# Patient Record
Sex: Female | Born: 1960 | Race: White | Hispanic: No | Smoking: Current every day smoker
Health system: Southern US, Community
[De-identification: ages and names within clinical notes are randomized; demographics above are authoritative.]

---

## 1998-05-18 ENCOUNTER — Ambulatory Visit (HOSPITAL_COMMUNITY): Admission: RE | Admit: 1998-05-18 | Discharge: 1998-05-18 | Payer: Self-pay | Admitting: Obstetrics and Gynecology

## 1998-07-24 ENCOUNTER — Ambulatory Visit (HOSPITAL_COMMUNITY): Admission: RE | Admit: 1998-07-24 | Discharge: 1998-07-24 | Payer: Self-pay | Admitting: Obstetrics and Gynecology

## 1999-01-09 ENCOUNTER — Other Ambulatory Visit: Admission: RE | Admit: 1999-01-09 | Discharge: 1999-01-09 | Payer: Self-pay | Admitting: Obstetrics and Gynecology

## 2000-06-29 ENCOUNTER — Ambulatory Visit (HOSPITAL_COMMUNITY): Admission: RE | Admit: 2000-06-29 | Discharge: 2000-06-29 | Payer: Self-pay | Admitting: Endocrinology

## 2000-06-29 ENCOUNTER — Encounter: Payer: Self-pay | Admitting: Endocrinology

## 2001-06-23 ENCOUNTER — Inpatient Hospital Stay (HOSPITAL_COMMUNITY): Admission: RE | Admit: 2001-06-23 | Discharge: 2001-06-25 | Payer: Self-pay | Admitting: Neurosurgery

## 2008-05-24 ENCOUNTER — Ambulatory Visit (HOSPITAL_COMMUNITY): Admission: RE | Admit: 2008-05-24 | Discharge: 2008-05-24 | Payer: Self-pay | Admitting: *Deleted

## 2011-04-22 NOTE — Op Note (Signed)
[name], [name]            ACCOUNT NO.:  [id number]   MEDICAL RECORD NO.:  [id number]          PATIENT TYPE:  AMB   LOCATION:  SDC                           FACILITY:  [facility]   PHYSICIAN:  Stout B. Earlene Plater, M.D.  DATE OF BIRTH:  [birth year]   DATE OF PROCEDURE:  DATE OF DISCHARGE:                               OPERATIVE REPORT   PREOPERATIVE DIAGNOSIS:  Labial hypertrophy.   POSTOPERATIVE DIAGNOSIS:  Labial hypertrophy.   PROCEDURE:  Labioplasty.   SURGEON:  Chester Holstein. Earlene Plater, M.D.   ASSISTANT:  None.   ANESTHESIA:  MAC and 10 mL of 1% Nesacaine local.   FINDINGS:  Bilateral labial hypertrophy.   SPECIMENS:  None.   BLOOD LOSS:  Minimal.   COMPLICATIONS:  None.   INDICATIONS:  The patient with symptomatic labial hypertrophy requesting  reduction in labial tissue to relieve dyspareunia, as well as discomfort  with tight fitting clothes.  The patient is made aware preoperatively in  the office this is not a cosmetic procedure, but rather simply debulking  of the redundant labial tissue to relieve her discomfort.  The patient  advised the risks of surgery including infection, bleeding, the damage  to surrounding organs, and potential increased risk for dyspareunia  postoperatively.   PROCEDURE:  The patient was taken to the operating room, MAC anesthesia  obtained.  She was prepped and draped in standard fashion.  Bladder  drained with an in-and-out cath.  Exam under anesthesia shows a  redundant labial tissue approximately 3 cm in anterior posterior  dimension.  The redundant labial tissue was marked with surgical marking  pen and infiltrated with local.  It was then excised with #15 blade and  skin reapproximated with a subcuticular stitch of 4-0 Vicryl.  Repeated  on the opposite side in the same manner.  Each site was hemostatic.   The patient tolerated the procedure well with no complications.  She was  taken to the recovery room awake, alert, and in good  condition.  All  counts were correct per the operating staff.      Gerri Spore B. Earlene Plater, M.D.  Electronically Signed     WBD/MEDQ  D:  05/24/2008  T:  05/25/2008  Job:  [id number]

## 2011-04-25 NOTE — H&P (Signed)
[name]  Patient:    Sandra [name], Sandra [name]                   MRN: [id number] Adm. Date:  [id number] Attending:  Tressie Stalker D                         History and Physical  CHIEF COMPLAINT:  Bilateral back pain, right greater than left leg pain.  HISTORY OF PRESENT ILLNESS:  The patient is a 50 year old white female who began having back pain a few months ago.  It became severe three weeks a go.  She failed medical management, worked up with a lumbar MRI that demonstrated a huge herniated disk at L3-4.  I recommended surgery, and the patient weighed the risks, benefits, and alternatives of surgery and decided to proceed with the operation.  PAST MEDICAL HISTORY:  Positive for ocular strabismus.  PAST SURGICAL HISTORY:  Eye surgery as a child.  MEDICATIONS:  Flexeril 10 mg p.o. t.i.d., prednisone taper, hydrocodone p.r.n.  ALLERGIES:  No known drug allergies.  FAMILY HISTORY:  The patients mother is age 92, in good health.  The patients father died at age 40 of myocardial infarction.  SOCIAL HISTORY:  The patient is married and has three children.  She lives in [place].  She smokes one pack per day of cigarettes x approximately 20 years.  I advised her to quit.  She drinks alcohol rarely.  Denies drug use.  REVIEW OF SYSTEMS:  Negative except as above.  PHYSICAL EXAMINATION:  VITAL SIGNS:  Height 5 feet 3 inches, weight 110 pounds.  GENERAL:  A pleasant, thin 50 year old white female, who walks with an antalgic gait, complaining of back and leg pain.  HEENT:  Normal.  NECK:  Normal.  CHEST:  Thorax symmetric.  Lungs clear to auscultation.  CARDIAC:  Regular rate and rhythm.  ABDOMEN:  Soft, nontender.  BACK:  No point tenderness, deformity.  Straight leg raise testing is positive bilaterally.  Fabers testing negative bilaterally.  NEUROLOGIC:  The patient is alert and oriented x 3.  Cranial nerves II-XII grossly intact  bilaterally.  Vision and hearing grossly normal.  Bilateral motor strength is 5/5 in bilateral deltoid, biceps, triceps, hand grip, and wrist extensor, interosseous, psoas, quadriceps, gastrocnemius, extensor hallucis longus, and left quadriceps.  Right quadriceps strength is diminished at 4+/5.  Cerebellar exam is intact in her rapid alternating movements of the upper extremities bilaterally.  Sensory exam is normal to light touch and pinprick sensation in all tested dermatomes bilaterally.  Deep tendon reflexes are 2/4 in bilateral biceps, triceps, brachioradialis, gastrocnemius, left quadriceps, trace in her right quadriceps.  IMAGING STUDIES:  The patient had a lumbar MRI performed without contrast at [facility] on June 08, 2001, which demonstrates a huge herniated disk at L3-4 causing spinal stenosis.  L4-5 has a mild central bulging disk.  The rest of the levels are fairly normal.  ASSESSMENT AND PLAN:  Huge herniated disk at L3-4, degenerative disk disease, spinal stenosis, lumbago, lumbar radiculopathy.  I have discussed the situation with patient and her husband and reviewed the MRI scan with them and pointed out the abnormalities.  She has the large ruptured disk at L3-4.  I have discussed the various treatment options, including doing nothing, continued medical management, steroid injections, and surgery.  I have recommended surgery.  I described the procedure of L3-4 bilateral microdiskectomy.  I have shown her surgical  models and discussed the risks of surgery extensively.  I also discussed the alternative of an L3-4 fusion.  The patient has elected to proceed with a bilateral L3-4 microdiskectomy on May 24, 2001. DD:  06/23/01 TD:  06/23/01 Job: [id number]

## 2011-04-25 NOTE — Op Note (Signed)
[place]  Patient:    Sandra [name], Sandra [name]                   MRN: [id number] Proc. Date: 06/23/01 Adm. Date:  [id number] Attending:  Tressie Stalker D                           Operative Report  PREOPERATIVE DIAGNOSES:  L3-4 herniated nucleus pulposus, degenerative disk disease, spinal stenosis, lumbago, lumbar radiculopathy.  POSTOPERATIVE DIAGNOSES:  L3-4 herniated nucleus pulposus, degenerative disk disease, spinal stenosis, lumbago, lumbar radiculopathy.  PROCEDURE:  Bilateral L3-4 microdiskectomy using microdissection.  SURGEON:  Cristi Loron, M.D.  ASSISTANT:  Hewitt Shorts, M.D.  ANESTHESIA:  General endotracheal.  ESTIMATED BLOOD LOSS:  Less than 100 cc.  SPECIMENS:  None.  DRAINS:  None.  COMPLICATIONS:  None.  BRIEF HISTORY:  The patient is a 50 year old white female who suffered from several months of back pain and several weeks of severe leg pain.  She failed medical management and was worked up as an outpatient with a lumbar MRI.  It demonstrated a large herniated disk at L3-4.  The patient had failed medical management, therefore weighed the risks, benefits, and alternatives of surgery and decided to proceed with an L3-4 microdiskectomy.  DESCRIPTION OF PROCEDURE:  The patient was brought to the operating room by the anesthesia team.  General endotracheal anesthesia was induced.  The patient was turned to the prone position on the Wilson frame.  Her lumbosacral region was then prepared with Betadine scrub and Betadine solution.  Sterile drapes were applied.  I then injected the area to be incised with Marcaine with epinephrine solution.  I then used the scalpel to make an incision over the L3-4 interspace.  I used electrocautery to dissect down to the thoracolumbar fascia.  I divided the fascia bilaterally and performed a bilateral subperiosteal dissection, stripping the paraspinous musculature from the  spinous processes and laminae of L3 and L4.  I inserted the McCullough retractor for exposure and then obtained an intraoperative radiograph to confirm my location.  I then brought the operating microscope into the field and under its magnification and illumination, I completed the decompression by microdissection.  I used the high-speed drill to perform bilateral L3 laminotomy.  I widened the laminotomies with a Kerrison punch, removing the bilateral L3-4 ligamentum flavum.  I performed a foraminotomy about the bilateral L4 nerve roots.  I then used microdissection to free up the right thecal sac and the right L4 nerve root from a large underlying free fragment disk herniation.  I then removed the large herniated disk in one large piece. This greatly decompressed the thecal sac and the nerve root.  I then went over to the left side and then palpated along the ventral aspect of the thecal sac and the L4 nerve root and noted there was no more neural compression from this herniated disk.  I then inspected the posterior longitudinal ligament.  There was a hole in it where the disk had herniated through and migrated into a cephalad direction.  I palpated about the hole.  I could not find any hole in the annulus fibrosus of the intervertebral disk, and I therefore did not incise the intervertebral disk.  I then achieved stringent hemostasis using bipolar electrocautery, palpated along the ventral surface of the thecal sac and bilateral L4 nerve root, and noted that the neural structures were well-decompressed.  I  should note that I performed a hemilaminectomy on the right side in order to gain more exposure of the thecal sac.  At this point, the neural structures were well decompressed.  I then removed the McCullough retractor and reapproximated the patients thoracolumbar fascia with interrupted #1 Vicryl suture, the subcutaneous tissue with interrupted 2-0 Vicryl suture, and the skin with  Steri-Strips and benzoin.  The wound was then coated with bacitracin ointment, a sterile dressing was applied, the drapes were removed.  The patient was subsequently returned to the supine position, where she was extubated by the anesthesia team and transported to the postanesthesia care unit in stable condition.  All sponge, instrument, and needle counts were correct at the end of this case. DD:  06/23/01 TD:  06/24/01 Job: [id number]

## 2011-09-04 LAB — DIFFERENTIAL
Basophils Absolute: 0
Basophils Relative: 1
Eosinophils Absolute: 0.1
Eosinophils Relative: 2
Lymphocytes Relative: 32
Lymphs Abs: 1.7
Monocytes Absolute: 0.4
Monocytes Relative: 8
Neutro Abs: 3.2
Neutrophils Relative %: 58

## 2011-09-04 LAB — PREGNANCY, URINE: Preg Test, Ur: NEGATIVE

## 2011-09-04 LAB — CBC
HCT: 38.6
Hemoglobin: 13.2
MCHC: 34.3
MCV: 104.8 — ABNORMAL HIGH
Platelets: 287
RBC: 3.68 — ABNORMAL LOW
RDW: 13.1
WBC: 5.4

## 2017-07-29 ENCOUNTER — Emergency Department (HOSPITAL_COMMUNITY)
Admission: EM | Admit: 2017-07-29 | Discharge: 2017-07-30 | Disposition: A | Discharge status: Home / Self Care | Payer: Worker's Compensation | Attending: Emergency Medicine | Admitting: Emergency Medicine

## 2017-07-29 ENCOUNTER — Emergency Department (HOSPITAL_COMMUNITY): Payer: Worker's Compensation

## 2017-07-29 DIAGNOSIS — W290XXA Contact with powered kitchen appliance, initial encounter: Secondary | ICD-10-CM | POA: Insufficient documentation

## 2017-07-29 DIAGNOSIS — S61011A Laceration without foreign body of right thumb without damage to nail, initial encounter: Secondary | ICD-10-CM | POA: Diagnosis not present

## 2017-07-29 DIAGNOSIS — Y92512 Supermarket, store or market as the place of occurrence of the external cause: Secondary | ICD-10-CM | POA: Insufficient documentation

## 2017-07-29 DIAGNOSIS — Y990 Civilian activity done for income or pay: Secondary | ICD-10-CM | POA: Insufficient documentation

## 2017-07-29 DIAGNOSIS — Y939 Activity, unspecified: Secondary | ICD-10-CM | POA: Diagnosis not present

## 2017-07-29 MED ORDER — LIDOCAINE HCL (PF) 1 % IJ SOLN
10.0000 mL | Freq: Once | INTRAMUSCULAR | Status: AC
  Administered 2017-07-29: 10 mL via INTRADERMAL
  Filled 2017-07-29: qty 30

## 2017-07-29 MED ORDER — IBUPROFEN 600 MG PO TABS: 600.0000 mg | ORAL_TABLET | Freq: Four times a day (QID) | ORAL | 0 refills | Status: AC | PRN

## 2017-07-29 MED ORDER — TETANUS-DIPHTH-ACELL PERTUSSIS 5-2.5-18.5 LF-MCG/0.5 IM SUSP
0.5000 mL | Freq: Once | INTRAMUSCULAR | Status: AC
  Administered 2017-07-29: 0.5 mL via INTRAMUSCULAR
  Filled 2017-07-29: qty 0.5

## 2017-07-29 NOTE — ED Provider Notes (Signed)
[facility]-EMERGENCY DEPT Provider Note   CSN: [id number] Arrival date & time: 07/29/17  2105     History   Chief Complaint Chief Complaint  Patient presents with  . Extremity Laceration    HPI Sandra [name] is a 56 y.o. female.  HPI   56 year old female presenting with L hand injury.  Pt works in the Clinical research associate at [employer].  Today a meat slicer machine tipped over and landed on her L non dominant hand.  Incident happened approximately 2 hrs ago.  Pain is throbbing, mild at 5/10, with laceration to L thumb.    No past medical history on file.  There are no active problems to display for this patient.   No past surgical history on file.  OB History    No data available       Home Medications    Prior to Admission medications   Not on File    Family History No family history on file.  Social History Social History  Substance Use Topics  . Smoking status: Not on file  . Smokeless tobacco: Not on file  . Alcohol use Not on file     Allergies   Patient has no allergy information on record.   Review of Systems Review of Systems  Constitutional: Negative for fever.  Skin: Positive for wound.  Neurological: Negative for numbness.     Physical Exam Updated Vital Signs BP 139/77 (BP Location: Left Arm)   Pulse 66   Temp 98.2 F (36.8 C) (Oral)   Resp 18   SpO2 99%   Physical Exam  Constitutional: She appears well-developed and well-nourished. No distress.  HENT:  Head: Atraumatic.  Eyes: Conjunctivae are normal.  Neck: Neck supple.  Musculoskeletal: She exhibits tenderness (L thumb: 2cm lac noted to proximal phalanx without bone involvement, FROM throught thumb, no nail involvement.  ).  Neurological: She is alert.  Skin: No rash noted.  Psychiatric: She has a normal mood and affect.  Nursing note and vitals reviewed.    ED Treatments / Results  Labs (all labs ordered are listed, but only abnormal results are displayed) Labs Reviewed -  No data to display  EKG  EKG Interpretation None       Radiology Dg Hand Complete Left  Result Date: 07/29/2017 CLINICAL DATA:  Thumb laceration after injury at work. EXAM: LEFT HAND - COMPLETE 3+ VIEW COMPARISON:  None. FINDINGS: There is no evidence of fracture or dislocation. Narrowing and sclerosis of the first carpometacarpal joint is noted. Soft tissues are unremarkable. IMPRESSION: Osteoarthritis of the first carpometacarpal joint. No acute abnormality seen in the left hand. Electronically Signed   By: Lupita Raider, M.D.   On: 07/29/2017 22:39    Procedures .Marland KitchenLaceration Repair Date/Time: 07/29/2017 11:13 PM Performed by: Fayrene Helper Authorized by: Fayrene Helper   Consent:    Consent obtained:  Verbal   Consent given by:  Patient   Risks discussed:  Infection, need for additional repair and poor wound healing   Alternatives discussed:  No treatment and delayed treatment Anesthesia (see MAR for exact dosages):    Anesthesia method:  Nerve block   Block needle gauge:  25 G   Block anesthetic:  Lidocaine 2% w/o epi   Block technique:  Digital nerve block   Block injection procedure:  Anatomic landmarks identified   Block outcome:  Anesthesia achieved Laceration details:    Location:  Finger   Finger location:  L thumb  Length (cm):  2   Depth (mm):  3 Repair type:    Repair type:  Simple Pre-procedure details:    Preparation:  Patient was prepped and draped in usual sterile fashion Exploration:    Hemostasis achieved with:  Direct pressure   Wound exploration: wound explored through full range of motion and entire depth of wound probed and visualized     Wound extent: no muscle damage noted, no nerve damage noted, no tendon damage noted and no underlying fracture noted     Contaminated: no   Treatment:    Area cleansed with:  Betadine   Amount of cleaning:  Standard   Irrigation solution:  Sterile saline   Irrigation volume:  200   Irrigation method:  Pressure  wash   Visualized foreign bodies/material removed: no   Skin repair:    Repair method:  Sutures   Suture size:  5-0   Suture material:  Prolene   Suture technique:  Simple interrupted Approximation:    Approximation:  Close Post-procedure details:    Dressing:  Adhesive bandage   Patient tolerance of procedure:  Tolerated well, no immediate complications   (including critical care time)  Medications Ordered in ED Medications  lidocaine (PF) (XYLOCAINE) 1 % injection 10 mL (not administered)     Initial Impression / Assessment and Plan / ED Course  I have reviewed the triage vital signs and the nursing notes.  Pertinent labs & imaging results that were available during my care of the patient were reviewed by me and considered in my medical decision making (see chart for details).  Clinical Course as of Jul 29 2237  Wed Jul 29, 2017  2232 DG Hand Complete Left [CK]    Clinical Course User Index [CK] Jacqlyn Krauss, Student-PA    BP 139/77 (BP Location: Left Arm)   Pulse 66   Temp 98.2 F (36.8 C) (Oral)   Resp 18   SpO2 99%    Final Clinical Impressions(s) / ED Diagnoses   Final diagnoses:  Laceration of right thumb without foreign body without damage to nail, initial encounter    New Prescriptions New Prescriptions   IBUPROFEN (ADVIL,MOTRIN) 600 MG TABLET    Take 1 tablet (600 mg total) by mouth every 6 (six) hours as needed for moderate pain.   11:14 PM Pt injured L non dominant hand.  Lac to L thumb repaired by me.  Sutures removal in 7 days.  Xray neg.  Care instruction given.    Fayrene Helper, PA-C 07/29/17 2315    Nira Conn, MD 07/29/17 [phone]

## 2017-07-29 NOTE — Discharge Instructions (Signed)
Please follow up with your doctor or with urgent care in 1 week for sutures removal.  Keep wound clean and dry for the first 12 hrs.

## 2017-07-29 NOTE — ED Triage Notes (Signed)
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

## 2017-08-06 ENCOUNTER — Emergency Department (HOSPITAL_COMMUNITY)
Admission: EM | Admit: 2017-08-06 | Discharge: 2017-08-06 | Disposition: A | Discharge status: Home / Self Care | Payer: Worker's Compensation | Attending: Emergency Medicine | Admitting: Emergency Medicine

## 2017-08-06 ENCOUNTER — Encounter (HOSPITAL_COMMUNITY): Payer: Self-pay | Admitting: Emergency Medicine

## 2017-08-06 DIAGNOSIS — Z4802 Encounter for removal of sutures: Secondary | ICD-10-CM | POA: Diagnosis not present

## 2017-08-06 DIAGNOSIS — F17210 Nicotine dependence, cigarettes, uncomplicated: Secondary | ICD-10-CM | POA: Insufficient documentation

## 2017-08-06 NOTE — ED Triage Notes (Signed)
Patient here to get sutures in left thumb removed. Patient denies any drainage, redness or worsening pain or any other s/s of infection.

## 2017-08-06 NOTE — Discharge Instructions (Signed)
If you have any concerns, signs of infection including redness, drainage of pus, or worsening pain please return to the emergency room for repeat evaluation.

## 2017-08-06 NOTE — ED Provider Notes (Signed)
[facility]-EMERGENCY DEPT Provider Note   CSN: [id number] Arrival date & time: 08/06/17  0828     History   Chief Complaint Chief Complaint  Patient presents with  . Suture / Staple Removal    HPI Sandra [name] is a 56 y.o. female who presents for removal of sutures in her left thumb. These were placed 8 days ago after she cut her finger at work.  She denies any fevers, chills, no obvious redness, bruising, or pus at the wound. She reports that her pain is controlled with over-the-counter pain medications.  HPI  History reviewed. No pertinent past medical history.  There are no active problems to display for this patient.   History reviewed. No pertinent surgical history.  OB History    No data available       Home Medications    Prior to Admission medications   Medication Sig Start Date End Date Taking? Authorizing Provider  ibuprofen (ADVIL,MOTRIN) 600 MG tablet Take 1 tablet (600 mg total) by mouth every 6 (six) hours as needed for moderate pain. 07/29/17   Fayrene Helperran, [place], PA-C    Family History No family history on file.  Social History Social History  Substance Use Topics  . Smoking status: Current Every Day Smoker    Types: Cigarettes  . Smokeless tobacco: Never Used  . Alcohol use Not on file     Allergies   Patient has no known allergies.   Review of Systems Review of Systems  Constitutional: Negative for fever.  Skin: Positive for wound. Negative for color change and pallor.     Physical Exam Updated Vital Signs BP (!) 156/87   Pulse 79   Temp 98.2 F (36.8 C) (Oral)   Resp 15   SpO2 100%   Physical Exam  Constitutional: She appears well-developed and well-nourished.  HENT:  Head: Normocephalic and atraumatic.  Musculoskeletal:  Motor function intact distal to laceration on left thumb.  Neurological:  Sensation intact to left thumb distal to laceration.  Skin: Skin is warm and dry. She is not diaphoretic.  No obvious abnormal  erythema, fluctuance, drainage.  Psychiatric: She has a normal mood and affect. Her behavior is normal.     ED Treatments / Results  Labs (all labs ordered are listed, but only abnormal results are displayed) Labs Reviewed - No data to display  EKG  EKG Interpretation None       Radiology No results found.  Procedures .Suture Removal Date/Time: 08/06/2017 8:58 AM Performed by: Cristina GongHAMMOND, Trevan Messman W Authorized by: Cristina GongHAMMOND, Erminia Mcnew W   Consent:    Consent obtained:  Verbal   Consent given by:  Patient   Risks discussed:  Bleeding, wound separation and pain   Alternatives discussed:  Delayed treatment and observation Location:    Location:  Upper extremity   Upper extremity location:  Hand   Hand location:  L thumb Procedure details:    Wound appearance:  No signs of infection, good wound healing and clean   Number of sutures removed:  4 Post-procedure details:    Post-removal:  Antibiotic ointment applied and Band-Aid applied Comments:     Tolerated well, patient began to feel mildly light headed after suture removal which resolved after briefly laying down.  She did not vomit or pass out.     Medications Ordered in ED Medications - No data to display   Initial Impression / Assessment and Plan / ED Course  I have reviewed the triage vital signs and the  nursing notes.  Pertinent labs & imaging results that were available during my care of the patient were reviewed by me and considered in my medical decision making (see chart for details).    Suture removal   Pt to ER for suture removal and wound check as above. Procedure tolerated well. Vitals normal, no signs of infection. Scar minimization & return precautions given at dc.     Final Clinical Impressions(s) / ED Diagnoses   Final diagnoses:  Visit for suture removal    New Prescriptions New Prescriptions   No medications on file     Norman Clay 08/06/17 1610    Alvira Monday,  MD 08/11/17 [phone]

## 2017-08-06 NOTE — ED Notes (Signed)
Bed: [facility] Expected date:  Expected time:  Means of arrival:  Comments: 

## 2019-12-15 ENCOUNTER — Other Ambulatory Visit: Payer: Self-pay | Admitting: Internal Medicine

## 2019-12-16 ENCOUNTER — Other Ambulatory Visit: Payer: Self-pay | Admitting: Internal Medicine

## 2019-12-16 DIAGNOSIS — Z1231 Encounter for screening mammogram for malignant neoplasm of breast: Secondary | ICD-10-CM

## 2019-12-16 DIAGNOSIS — R5381 Other malaise: Secondary | ICD-10-CM

## 2020-12-11 ENCOUNTER — Other Ambulatory Visit: Payer: Self-pay | Admitting: Internal Medicine

## 2020-12-11 DIAGNOSIS — Z0000 Encounter for general adult medical examination without abnormal findings: Secondary | ICD-10-CM

## 2020-12-11 DIAGNOSIS — F17200 Nicotine dependence, unspecified, uncomplicated: Secondary | ICD-10-CM

## 2020-12-31 ENCOUNTER — Other Ambulatory Visit: Payer: Self-pay

## 2020-12-31 ENCOUNTER — Ambulatory Visit
Admission: RE | Admit: 2020-12-31 | Discharge: 2020-12-31 | Disposition: A | Discharge status: Home / Self Care | Payer: BC Managed Care – PPO | Source: Ambulatory Visit | Attending: Internal Medicine | Admitting: Internal Medicine

## 2020-12-31 DIAGNOSIS — Z0000 Encounter for general adult medical examination without abnormal findings: Secondary | ICD-10-CM

## 2020-12-31 DIAGNOSIS — F17200 Nicotine dependence, unspecified, uncomplicated: Secondary | ICD-10-CM

## 2021-09-10 ENCOUNTER — Other Ambulatory Visit: Payer: Self-pay | Admitting: Internal Medicine

## 2021-09-10 DIAGNOSIS — Z0000 Encounter for general adult medical examination without abnormal findings: Secondary | ICD-10-CM

## 2022-06-19 IMAGING — CT CT CHEST LUNG CANCER SCREENING LOW DOSE W/O CM
1 series · 14 of 32 positions shown, 18 images · non-contrast
Comparison: No priors.

CLINICAL DATA: 59-year-old female current smoker with 42 pack-year
history of smoking. Lung cancer screening examination.

EXAM:
CT CHEST WITHOUT CONTRAST LOW-DOSE FOR LUNG CANCER SCREENING
TECHNIQUE: Multidetector CT imaging of the chest was performed following the
standard protocol without IV contrast.

[Series 3: ldct screening <30 bmi · axial · 0.62mm/px · z∈[-341,-66]mm · 14 of 65 slices shown, 18 images]
[im 5/65  mediastinal]
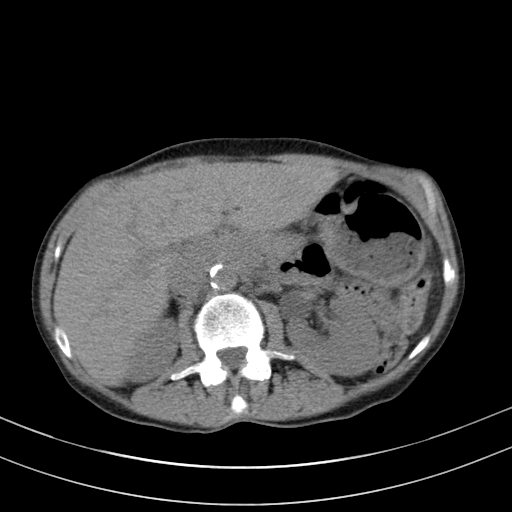
[im 5/65  lung]
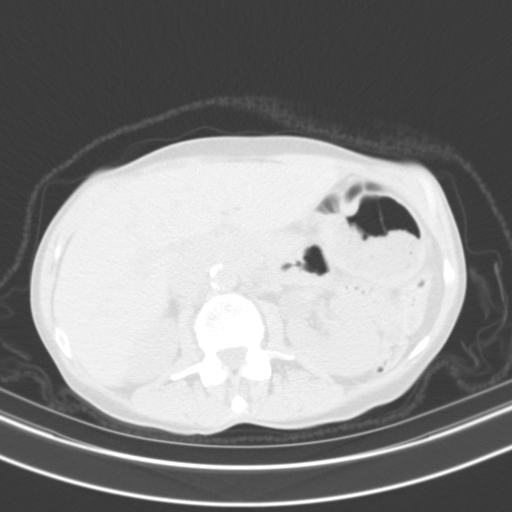
[im 10/65  lung]
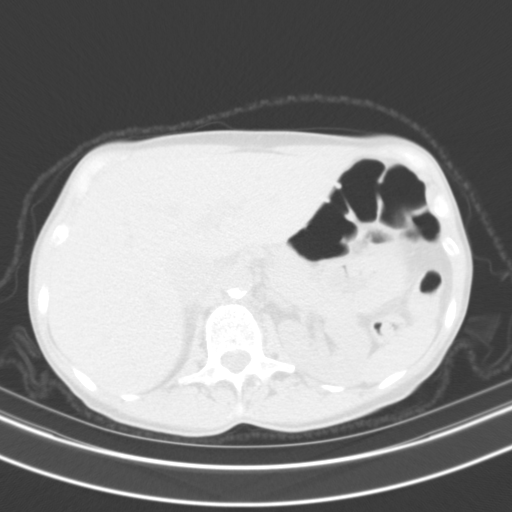
[im 13/65  lung]
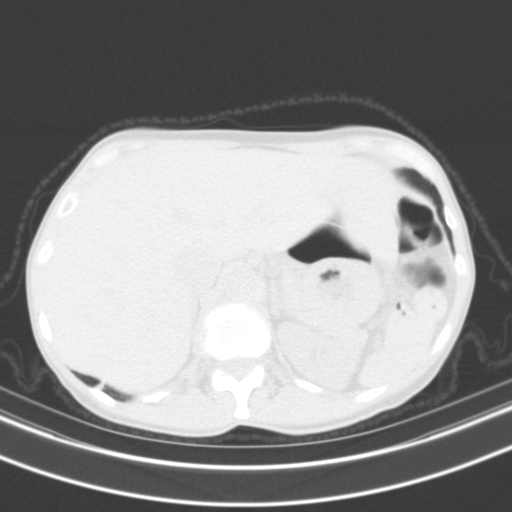
[im 17/65  lung]
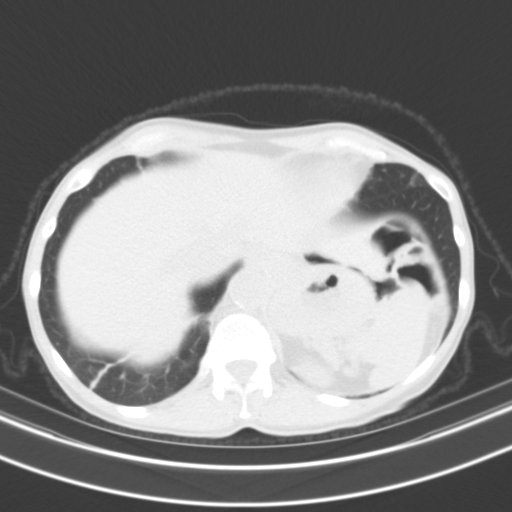
[im 22/65  mediastinal]
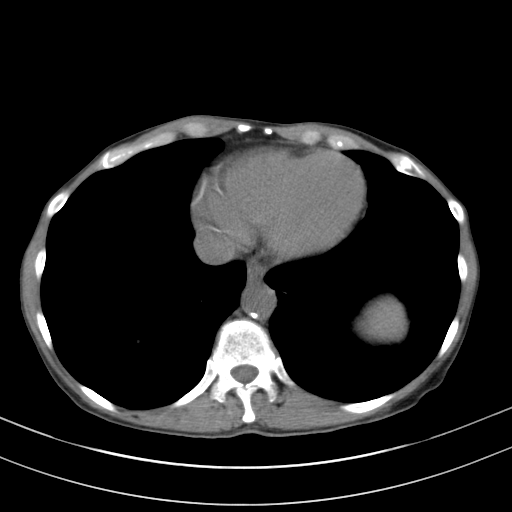
[im 22/65  lung]
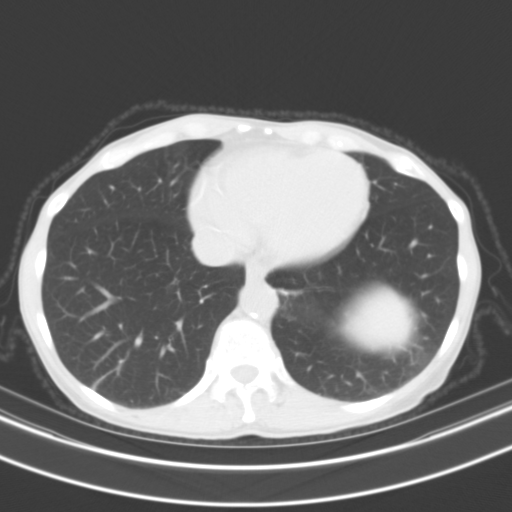
[im 26/65  lung]
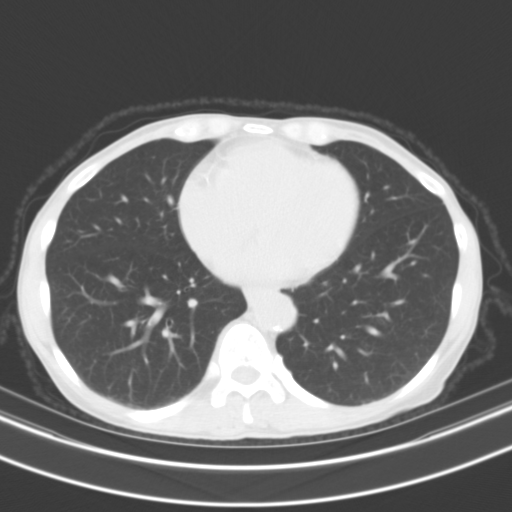
[im 29/65  lung]
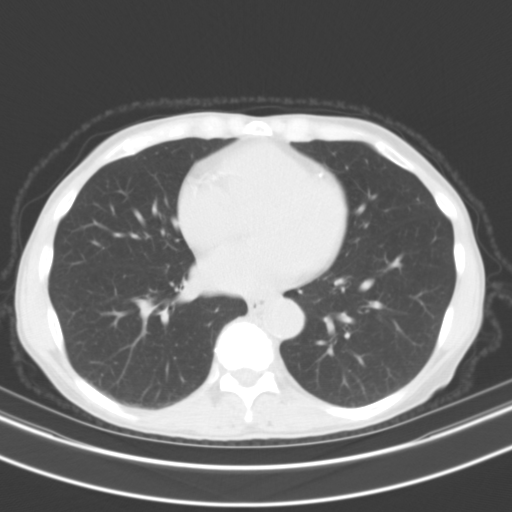
[im 36/65  lung]
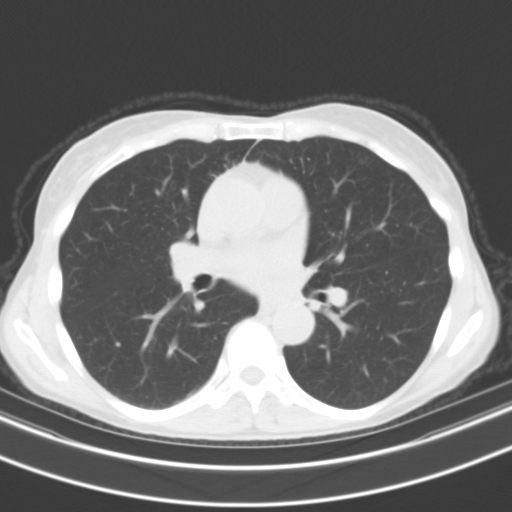
[im 39/65  mediastinal]
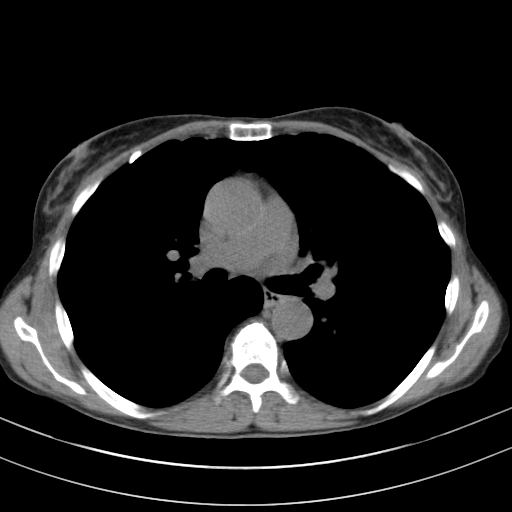
[im 39/65  lung]
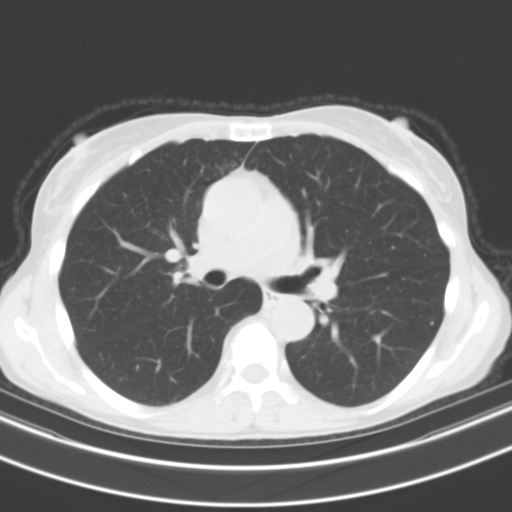
[im 43/65  lung]
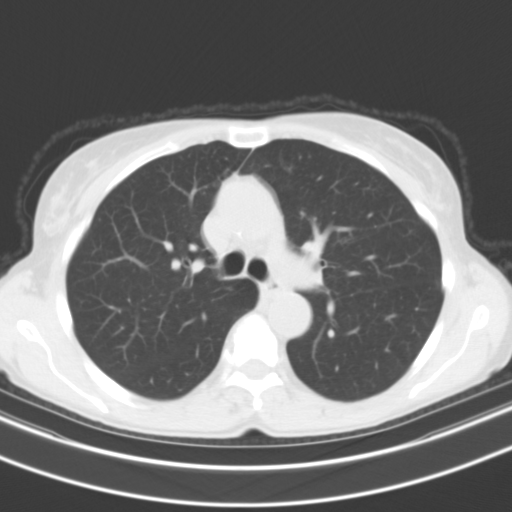
[im 48/65  lung]
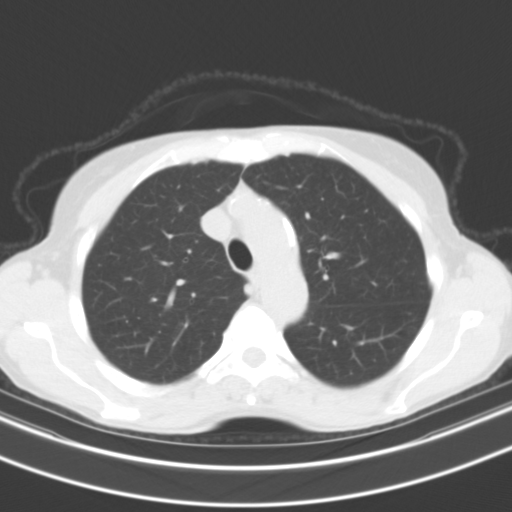
[im 52/65  lung]
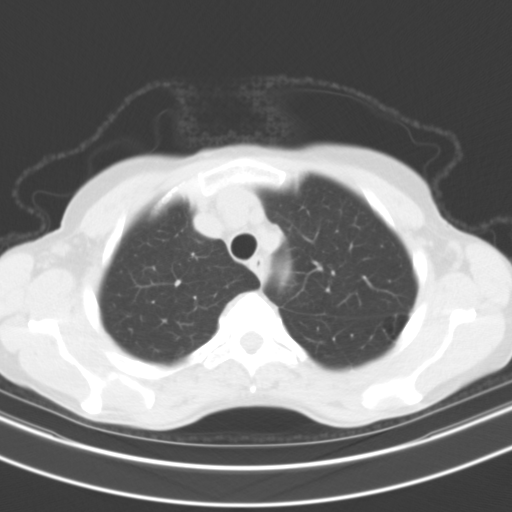
[im 55/65  mediastinal]
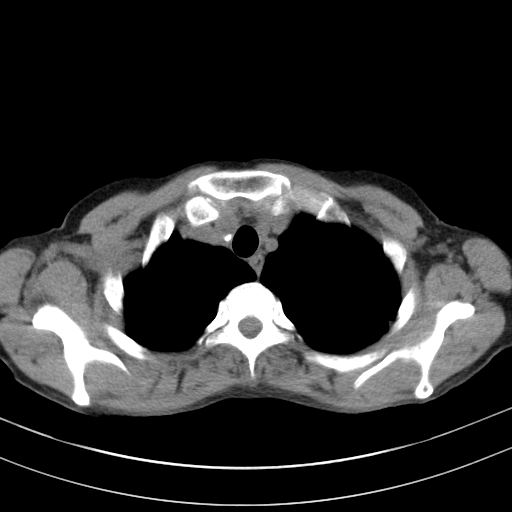
[im 55/65  lung]
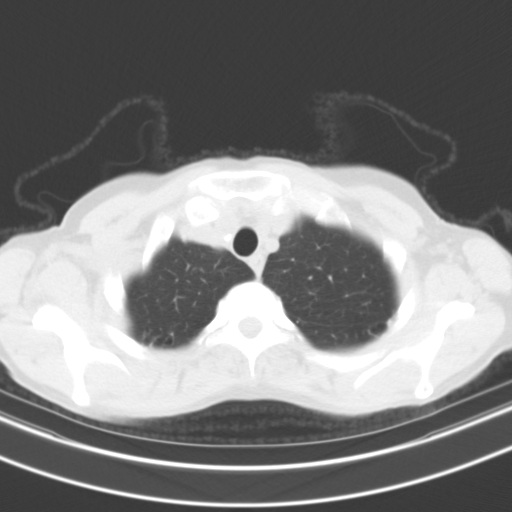
[im 60/65  lung]
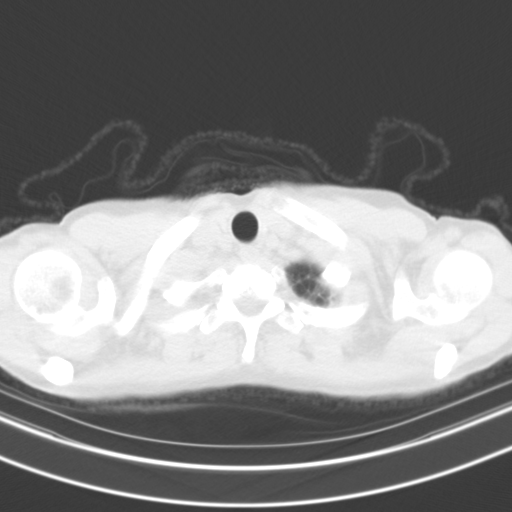

[14 of 32 positions shown; findings below may reference images not displayed]

FINDINGS: Cardiovascular: Heart size is normal. There is no significant
pericardial fluid, thickening or pericardial calcification. There is
aortic atherosclerosis, as well as atherosclerosis of the great
vessels of the mediastinum and the coronary arteries, including
calcified atherosclerotic plaque in the left main, left anterior
descending, left circumflex and right coronary arteries.

Mediastinum/Nodes: No pathologically enlarged mediastinal or hilar
lymph nodes. Please note that accurate exclusion of hilar adenopathy
is limited on noncontrast CT scans. Esophagus is unremarkable in
appearance. No axillary lymphadenopathy.

Lungs/Pleura: Multiple small pulmonary nodules are scattered
throughout the lungs bilaterally, largest of which is in the
periphery of the left upper lobe associated with the major fissure
(axial image 51 of series 3), with a volume derived mean diameter
4.2 mm. No other larger more suspicious appearing pulmonary nodules
or masses are noted. No acute consolidative airspace disease. No
pleural effusions. Mild diffuse bronchial wall thickening with mild
centrilobular and paraseptal emphysema.

Upper Abdomen: Aortic atherosclerosis. Left-sided extrarenal pelvis
(normal anatomical variant) incidentally noted.

Musculoskeletal: There are no aggressive appearing lytic or blastic
lesions noted in the visualized portions of the skeleton.
IMPRESSION: 1. Lung-RADS 2S, benign appearance or behavior. Continue annual
screening with low-dose chest CT without contrast in 12 months.
2. The "S" modifier above refers to potentially clinically
significant non lung cancer related findings. Specifically, there is
aortic atherosclerosis, in addition to left main and 3 vessel
coronary artery disease. Please note that although the presence of
coronary artery calcium documents the presence of coronary artery
disease, the severity of this disease and any potential stenosis
cannot be assessed on this non-gated CT examination. Assessment for
potential risk factor modification, dietary therapy or pharmacologic
therapy may be warranted, if clinically indicated.
3. Mild diffuse bronchial wall thickening with mild centrilobular
and paraseptal emphysema; imaging findings suggestive of underlying
COPD.

Aortic Atherosclerosis (BDGE6-MA5.5) and Emphysema (BDGE6-MHI.M).

## 2023-01-09 ENCOUNTER — Other Ambulatory Visit: Payer: Self-pay | Admitting: Internal Medicine

## 2023-01-09 DIAGNOSIS — Z0000 Encounter for general adult medical examination without abnormal findings: Secondary | ICD-10-CM

## 2023-03-10 ENCOUNTER — Ambulatory Visit
Admission: RE | Admit: 2023-03-10 | Discharge: 2023-03-10 | Disposition: A | Discharge status: Home / Self Care | Payer: BC Managed Care – PPO | Source: Ambulatory Visit | Attending: Internal Medicine | Admitting: Internal Medicine

## 2023-03-10 DIAGNOSIS — Z0000 Encounter for general adult medical examination without abnormal findings: Secondary | ICD-10-CM

## 2024-01-19 ENCOUNTER — Other Ambulatory Visit: Payer: Self-pay | Admitting: Internal Medicine

## 2024-01-19 DIAGNOSIS — F17200 Nicotine dependence, unspecified, uncomplicated: Secondary | ICD-10-CM
